# Patient Record
Sex: Female | Born: 1970 | Race: White | Hispanic: Yes | Marital: Married | Smoking: Never smoker
Health system: Southern US, Community
[De-identification: ages and names within clinical notes are randomized; demographics above are authoritative.]

## PROBLEM LIST (undated history)

## (undated) DIAGNOSIS — E039 Hypothyroidism, unspecified: Secondary | ICD-10-CM

## (undated) DIAGNOSIS — I10 Essential (primary) hypertension: Secondary | ICD-10-CM

## (undated) DIAGNOSIS — E559 Vitamin D deficiency, unspecified: Secondary | ICD-10-CM

## (undated) DIAGNOSIS — J019 Acute sinusitis, unspecified: Secondary | ICD-10-CM

## (undated) DIAGNOSIS — J069 Acute upper respiratory infection, unspecified: Secondary | ICD-10-CM

## (undated) DIAGNOSIS — T7840XA Allergy, unspecified, initial encounter: Secondary | ICD-10-CM

## (undated) DIAGNOSIS — E785 Hyperlipidemia, unspecified: Secondary | ICD-10-CM

## (undated) HISTORY — DX: Hypothyroidism, unspecified: E03.9

## (undated) HISTORY — DX: Acute upper respiratory infection, unspecified: J06.9

## (undated) HISTORY — DX: Essential (primary) hypertension: I10

## (undated) HISTORY — DX: Acute sinusitis, unspecified: J01.90

## (undated) HISTORY — DX: Vitamin D deficiency, unspecified: E55.9

## (undated) HISTORY — DX: Hyperlipidemia, unspecified: E78.5

## (undated) HISTORY — DX: Allergy, unspecified, initial encounter: T78.40XA

---

## 1999-03-05 HISTORY — PX: CHOLECYSTECTOMY: SHX55

## 1999-05-08 ENCOUNTER — Other Ambulatory Visit: Admission: RE | Admit: 1999-05-08 | Discharge: 1999-05-08 | Payer: Self-pay | Admitting: Gynecology

## 2002-10-06 ENCOUNTER — Other Ambulatory Visit: Admission: RE | Admit: 2002-10-06 | Discharge: 2002-10-06 | Payer: Self-pay | Admitting: Gynecology

## 2003-10-20 ENCOUNTER — Other Ambulatory Visit: Admission: RE | Admit: 2003-10-20 | Discharge: 2003-10-20 | Payer: Self-pay | Admitting: Gynecology

## 2003-12-09 ENCOUNTER — Inpatient Hospital Stay (HOSPITAL_COMMUNITY): Admission: AD | Admit: 2003-12-09 | Discharge: 2003-12-13 | Payer: Self-pay | Admitting: Gynecology

## 2005-02-15 ENCOUNTER — Other Ambulatory Visit: Admission: RE | Admit: 2005-02-15 | Discharge: 2005-02-15 | Payer: Self-pay | Admitting: Gynecology

## 2018-09-01 ENCOUNTER — Other Ambulatory Visit: Payer: Self-pay | Admitting: Family Medicine

## 2018-09-01 ENCOUNTER — Ambulatory Visit: Payer: Self-pay

## 2018-09-01 ENCOUNTER — Other Ambulatory Visit: Payer: Self-pay

## 2018-09-01 DIAGNOSIS — M79672 Pain in left foot: Secondary | ICD-10-CM

## 2018-09-01 DIAGNOSIS — M25572 Pain in left ankle and joints of left foot: Secondary | ICD-10-CM

## 2019-01-03 ENCOUNTER — Other Ambulatory Visit: Payer: Self-pay

## 2019-01-03 ENCOUNTER — Emergency Department (HOSPITAL_COMMUNITY)
Admission: EM | Admit: 2019-01-03 | Discharge: 2019-01-03 | Disposition: A | Discharge status: Home / Self Care | Payer: Self-pay | Attending: Emergency Medicine | Admitting: Emergency Medicine

## 2019-01-03 ENCOUNTER — Emergency Department (HOSPITAL_COMMUNITY): Payer: Self-pay

## 2019-01-03 DIAGNOSIS — U071 COVID-19: Secondary | ICD-10-CM | POA: Insufficient documentation

## 2019-01-03 DIAGNOSIS — Z20822 Contact with and (suspected) exposure to covid-19: Secondary | ICD-10-CM

## 2019-01-03 DIAGNOSIS — R1011 Right upper quadrant pain: Secondary | ICD-10-CM | POA: Insufficient documentation

## 2019-01-03 LAB — COMPREHENSIVE METABOLIC PANEL
ALT: 225 U/L — ABNORMAL HIGH (ref 0–44)
AST: 169 U/L — ABNORMAL HIGH (ref 15–41)
Albumin: 4.3 g/dL (ref 3.5–5.0)
Alkaline Phosphatase: 146 U/L — ABNORMAL HIGH (ref 38–126)
Anion gap: 11 (ref 5–15)
BUN: 7 mg/dL (ref 6–20)
CO2: 22 mmol/L (ref 22–32)
Calcium: 8.6 mg/dL — ABNORMAL LOW (ref 8.9–10.3)
Chloride: 100 mmol/L (ref 98–111)
Creatinine, Ser: 0.6 mg/dL (ref 0.44–1.00)
GFR calc Af Amer: >60 mL/min (ref >60)
GFR calc non Af Amer: >60 mL/min (ref >60)
Glucose, Bld: 133 mg/dL — ABNORMAL HIGH (ref 70–99)
Potassium: 3.6 mmol/L (ref 3.5–5.1)
Sodium: 133 mmol/L — ABNORMAL LOW (ref 135–145)
Total Bilirubin: 0.7 mg/dL (ref 0.3–1.2)
Total Protein: 8.6 g/dL — ABNORMAL HIGH (ref 6.5–8.1)

## 2019-01-03 LAB — CBC WITH DIFFERENTIAL/PLATELET
Abs Immature Granulocytes: 0.01 10*3/uL (ref 0.00–0.07)
Basophils Absolute: 0 10*3/uL (ref 0.0–0.1)
Basophils Relative: 0 %
Eosinophils Absolute: 0 10*3/uL (ref 0.0–0.5)
Eosinophils Relative: 0 %
HCT: 42.7 % (ref 36.0–46.0)
Hemoglobin: 14.3 g/dL (ref 12.0–15.0)
Immature Granulocytes: 0 %
Lymphocytes Relative: 29 %
Lymphs Abs: 1.7 10*3/uL (ref 0.7–4.0)
MCH: 32.1 pg (ref 26.0–34.0)
MCHC: 33.5 g/dL (ref 30.0–36.0)
MCV: 95.7 fL (ref 80.0–100.0)
Monocytes Absolute: 0.5 10*3/uL (ref 0.1–1.0)
Monocytes Relative: 9 %
Neutro Abs: 3.6 10*3/uL (ref 1.7–7.7)
Neutrophils Relative %: 62 %
Platelets: 230 10*3/uL (ref 150–400)
RBC: 4.46 MIL/uL (ref 3.87–5.11)
RDW: 12.3 % (ref 11.5–15.5)
WBC: 5.9 10*3/uL (ref 4.0–10.5)
nRBC: 0 % (ref 0.0–0.2)

## 2019-01-03 LAB — URINALYSIS, ROUTINE W REFLEX MICROSCOPIC
Bilirubin Urine: NEGATIVE
Glucose, UA: NEGATIVE mg/dL
Hgb urine dipstick: NEGATIVE
Ketones, ur: NEGATIVE mg/dL
Leukocytes,Ua: NEGATIVE
Nitrite: NEGATIVE
Specific Gravity, Urine: 1.01 (ref 1.005–1.030)
pH: 8.5 — ABNORMAL HIGH (ref 5.0–8.0)

## 2019-01-03 LAB — URINALYSIS, MICROSCOPIC (REFLEX)
Bacteria, UA: NONE SEEN
RBC / HPF: NONE SEEN RBC/hpf (ref 0–5)
Squamous Epithelial / LPF: NONE SEEN (ref 0–5)
WBC, UA: NONE SEEN WBC/hpf (ref 0–5)

## 2019-01-03 LAB — TROPONIN I (HIGH SENSITIVITY)
Troponin I (High Sensitivity): 3 ng/L (ref <18)
Troponin I (High Sensitivity): <2 ng/L (ref <18)

## 2019-01-03 LAB — LIPASE, BLOOD: Lipase: 31 U/L (ref 11–51)

## 2019-01-03 LAB — I-STAT BETA HCG BLOOD, ED (MC, WL, AP ONLY): I-stat hCG, quantitative: <5 m[IU]/mL (ref <5)

## 2019-01-03 LAB — SARS CORONAVIRUS 2 (TAT 6-24 HRS): SARS Coronavirus 2: POSITIVE — AB

## 2019-01-03 MED ORDER — METHOCARBAMOL 500 MG PO TABS: 500.0000 mg | ORAL_TABLET | Freq: Two times a day (BID) | ORAL | 0 refills | Status: AC

## 2019-01-03 MED ORDER — ONDANSETRON HCL 4 MG/2ML IJ SOLN
4.0000 mg | Freq: Once | INTRAMUSCULAR | Status: AC
  Administered 2019-01-03: 4 mg via INTRAVENOUS
  Filled 2019-01-03: qty 2

## 2019-01-03 MED ORDER — ACETAMINOPHEN 500 MG PO TABS
1000.0000 mg | ORAL_TABLET | Freq: Once | ORAL | Status: AC
  Administered 2019-01-03: 07:00:00 1000 mg via ORAL
  Filled 2019-01-03: qty 2

## 2019-01-03 MED ORDER — SODIUM CHLORIDE 0.9 % IV BOLUS
1000.0000 mL | Freq: Once | INTRAVENOUS | Status: AC
  Administered 2019-01-03: 1000 mL via INTRAVENOUS

## 2019-01-03 MED ORDER — ZINC GLUCONATE 50 MG PO TABS: 50.0000 mg | ORAL_TABLET | Freq: Every day | ORAL | 0 refills | Status: AC

## 2019-01-03 NOTE — ED Triage Notes (Signed)
Pt states that her husband is COVID+, she had test done Wednesday, but does not have results yet. Pt states that she is SOB, slight cough, having abd pain, lower back pain, dizziness, headache, fever, and racing heart/palpatations. Pts symptoms started Thursday.

## 2019-01-03 NOTE — ED Provider Notes (Addendum)
Accepted handoff at shift change from [facility]. Please see prior provider note for more detail.   S: 48 y.o. female who presents with 3 days of cough, body aches, nausea, abdominal pain, chest pressure.  Husband is Covid positive.   Presents with low-grade fever, increased respiratory rate.   PE/labs: Clear lungs, no respiratory distress.  Abdomen diffusely tender, no focal tenderness.  Labs pending.   DDX: concern for COVID-19 viral illness.   Plan: Patient received fluids, Zofran, Tylenol will reassess and likely discharge with conservative management plus Zofran, recommendations for over-the-counter medications.     Physical Exam  BP 124/69   Pulse 79   Temp 100.3 F (37.9 C) (Oral)   Resp (!) 24   Ht 5\' 4"  (1.626 m)   Wt 104.3 kg   SpO2 99%   BMI 39.48 kg/m   Physical Exam Vitals signs and nursing note reviewed.  Constitutional:      General: She is not in acute distress.    Appearance: Normal appearance.  HENT:     Head: Normocephalic and atraumatic.     Nose: Nose normal.  Eyes:     General: No scleral icterus.       Right eye: No discharge.        Left eye: No discharge.     Conjunctiva/sclera: Conjunctivae normal.  Neck:     Musculoskeletal: Normal range of motion.  Cardiovascular:     Rate and Rhythm: Normal rate and regular rhythm.     Pulses: Normal pulses.     Heart sounds: Normal heart sounds.  Pulmonary:     Effort: Pulmonary effort is normal. No respiratory distress.     Breath sounds: No wheezing.     Comments: Patient speaking full senses, no apparent respiratory distress, breathing sounds normal, respiratory rate 18, no accessory muscle usage Abdominal:     Palpations: Abdomen is soft.     Tenderness: There is abdominal tenderness. There is no right CVA tenderness or left CVA tenderness.     Comments: Diffuse abdominal tenderness that is mild without guarding, rebound. RUQ tenderness, negative murphy's sign.  Musculoskeletal:     Right  lower leg: No edema.     Left lower leg: No edema.  Skin:    General: Skin is warm and dry.     Capillary Refill: Capillary refill takes less than 2 seconds.  Neurological:     Mental Status: She is alert and oriented to person, place, and time. Mental status is at baseline.  Psychiatric:        Mood and Affect: Mood normal.        Behavior: Behavior normal.     ED Course/Procedures     Procedures  Dg Chest Portable 1 View  Result Date: 01/03/2019 CLINICAL DATA:  Worsening cough and generalized month myalgias for the past 3 days. Nausea and decreased appetite. Chest pressure and shortness of breath. Husband recently diagnosed with COVID-19. EXAM: PORTABLE CHEST 1 VIEW COMPARISON:  None. FINDINGS: The heart size and mediastinal contours are within normal limits. Both lungs are clear. The visualized skeletal structures are unremarkable. IMPRESSION: Normal examination. Electronically Signed   By: Claudie Revering M.D.   On: 01/03/2019 07:54   Labs Reviewed  COMPREHENSIVE METABOLIC PANEL - Abnormal; Notable for the following components:      Result Value   Sodium 133 (*)    Glucose, Bld 133 (*)    Calcium 8.6 (*)    Total Protein 8.6 (*)  AST 169 (*)    ALT 225 (*)    Alkaline Phosphatase 146 (*)    All other components within normal limits  URINALYSIS, ROUTINE W REFLEX MICROSCOPIC - Abnormal; Notable for the following components:   pH 8.5 (*)    Protein, ur TRACE (*)    All other components within normal limits  SARS CORONAVIRUS 2 (TAT 6-24 HRS)  LIPASE, BLOOD  CBC WITH DIFFERENTIAL/PLATELET  URINALYSIS, MICROSCOPIC (REFLEX)  I-STAT BETA HCG BLOOD, ED ([facility], [facility], [facility] ONLY)  TROPONIN I (HIGH SENSITIVITY)  TROPONIN I (HIGH SENSITIVITY)     MDM   Patient without white count elevation, no signs of infection on urinalysis.  Lipase within normal limits.  Independently reviewed chest x-ray and EKG both are without concerning findings.  Troponins negative.  Covid lab pending.     Patient with elevated transaminases--no baseline for comparison; patient states she is s/p cholecystectomy and denies history of pancreatitis. 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   Patient is mainly complaining of body aches and fatigue; likely transaminitis is chronic or otherwise not related to current presentation. Will recommend close follow up with PCP. Discussed OTC medication options. Patient requests medication for her backache will give robaxin for nighttime body aches.   Patient feels improved after IV fluids, Zofran, Tylenol.  Patient comfortable with being discharged home.  Agreeable to plan. Encouraged to follow-up with primary care as she has transaminase elevation and liver issues consistent with chronic steatohepatitis.  Patient standing.  Will follow up with PCP.  Understands recommendations for medications including Tylenol and others per discussion.  Patient given strict return precautions.    The patient appears reasonably screened and/or stabilized for discharge and I doubt any other medical condition or other [facility] requiring further screening, evaluation, or treatment in the ED at this time prior to discharge.  Patient is hemodynamically stable, in NAD, and able to ambulate in the ED. Pain has been managed or a plan has been made for home management and has no complaints prior to discharge. Patient is comfortable with above plan and is stable for discharge at this time. All questions were answered prior to disposition. Results from the ER workup discussed with the patient face to face and all questions answered to the best of my ability. The patient is safe for discharge with strict return precautions. Patient appears safe for discharge with appropriate follow-up.  Conveyed my impression with the patient and he voiced  understanding and is agreeable to plan.   An After Visit Summary was printed and given to the patient.  Portions of this note were generated with Scientist, clinical (histocompatibility and immunogenetics). Dictation errors may occur despite best attempts at proofreading.     Gailen Shelter, [place] 01/03/19 1138    Gailen Shelter, [place] 01/03/19 1141    Lorre Nick, MD 01/04/19 [phone]

## 2019-01-03 NOTE — ED Notes (Signed)
As I write this, she is undergoing her abd. U/s.

## 2019-01-03 NOTE — ED Provider Notes (Signed)
[facility] DEPT Provider Note   CSN: [id number] Arrival date & time: 01/03/19  3267     History   Chief Complaint Chief Complaint  Patient presents with  . covid  . Shortness of Breath    HPI Amber Payne is a 48 y.o. female who presents for evaluation of left 3 days of progressive worsening cough, generalized body aches, nausea, decreased appetite, shortness of breath, chest pressure.  Patient reports that her husband was recently diagnosed with COVID-19.  She has had an outpatient test done but she does not know the results of it.  She reports that about 3 days ago, she started developing symptoms.  She reports that since then, they have gotten significantly worse.  She states she has no energy and has generalized soreness, including to her back and abdomen.  She states she has had nausea but no vomiting but does report that she is had decreased appetite.  She states that she has had worsening shortness of breath and chest pressure.  She does state that the shortness of breath and chest pressure is worse with coughing.  She reports that after she gets into a coughing fit, she feels like her heart races and she gets lightheaded.  She states that she has run a fever and states that yesterday it was 100.1.  She has not been taking any Tylenol or ibuprofen.  She denies any dysuria, hematuria.  She does not smoke.  Denies any history of asthma.  She states she has been trying to stay away from her husband but they have been sharing the same house.     The history is provided by the patient.    No past medical history on file.  There are no active problems to display for this patient.   The histories are not reviewed yet. Please review them in the "History" navigator section and refresh this [place].   OB History   No obstetric history on file.      Home Medications    Prior to Admission medications   Not on File    Family History No family  history on file.  Social History Social History   Tobacco Use  . Smoking status: Not on file  Substance Use Topics  . Alcohol use: Not on file  . Drug use: Not on file     Allergies   Phenazopyridine   Review of Systems Review of Systems  Constitutional: Positive for appetite change, fatigue and fever.  Respiratory: Positive for cough and shortness of breath.   Cardiovascular: Positive for chest pain.  Gastrointestinal: Positive for nausea. Negative for abdominal pain and vomiting.  Genitourinary: Negative for dysuria and hematuria.  Musculoskeletal: Positive for myalgias.  Neurological: Negative for headaches.  All other systems reviewed and are negative.    Physical Exam Updated Vital Signs BP 113/88 (BP Location: Left Arm)   Pulse 71   Temp 100.3 F (37.9 C) (Oral)   Resp (!) 22   Ht 5\' 4"  (1.626 m)   Wt 104.3 kg   SpO2 100%   BMI 39.48 kg/m   Physical Exam Vitals signs and nursing note reviewed.  Constitutional:      Appearance: Normal appearance. She is well-developed.  HENT:     Head: Normocephalic and atraumatic.  Eyes:     General: Lids are normal.     Conjunctiva/sclera: Conjunctivae normal.     Pupils: Pupils are equal, round, and reactive to light.  Neck:  Musculoskeletal: Full passive range of motion without pain.  Cardiovascular:     Rate and Rhythm: Normal rate and regular rhythm.     Pulses: Normal pulses.     Heart sounds: Normal heart sounds. No murmur. No friction rub. No gallop.   Pulmonary:     Effort: Pulmonary effort is normal.     Breath sounds: Normal breath sounds.     Comments: Lungs clear to auscultation bilaterally.  Symmetric chest rise.  No wheezing, rales, rhonchi.  Able to speak in full sentences without any difficulty. Abdominal:     Palpations: Abdomen is soft. Abdomen is not rigid.     Tenderness: There is generalized abdominal tenderness. There is no guarding.     Comments: Abdomen soft, nondistended.  Diffuse  generalized tenderness with no focal point.  No rigidity, guarding.  Musculoskeletal: Normal range of motion.  Skin:    General: Skin is warm and dry.     Capillary Refill: Capillary refill takes less than 2 seconds.  Neurological:     Mental Status: She is alert and oriented to person, place, and time.  Psychiatric:        Speech: Speech normal.      ED Treatments / Results  Labs (all labs ordered are listed, but only abnormal results are displayed) Labs Reviewed  SARS CORONAVIRUS 2 (TAT 6-24 HRS)  COMPREHENSIVE METABOLIC PANEL  LIPASE, BLOOD  CBC WITH DIFFERENTIAL/PLATELET  URINALYSIS, ROUTINE W REFLEX MICROSCOPIC  I-STAT BETA HCG BLOOD, ED ([facility], [facility], [facility] ONLY)  TROPONIN I (HIGH SENSITIVITY)    EKG None  Radiology No results found.  Procedures Procedures (including critical care time)  Medications Ordered in ED Medications  sodium chloride 0.9 % bolus 1,000 mL (1,000 mLs Intravenous New Bag/Given 01/03/19 0635)  ondansetron (ZOFRAN) injection 4 mg (4 mg Intravenous Given 01/03/19 0639)  acetaminophen (TYLENOL) tablet 1,000 mg (1,000 mg Oral Given 01/03/19 [id number])     Initial Impression / Assessment and Plan / ED Course  I have reviewed the triage vital signs and the nursing notes.  Pertinent labs & imaging results that were available during my care of the patient were reviewed by me and considered in my medical decision making (see chart for details).        48 year old female who presents for evaluation of 3 days of progressively worsening cough, shortness of breath, chest pressure, generalized myalgias, fatigue, nausea, abdominal pain.  Reports has been recently diagnosed with COVID-19.  She has had an outpatient test done but she does not know the results.  No history of asthma.  She does not smoke.  On initial ED arrival, she has a low-grade temperature of 100.3.  Vitals otherwise stable.  No evidence of hypoxia.  No evidence of respiratory distress on lung exam.   She has diffuse abdominal tenderness with no focal point.  Consider viral process such as COVID-19, particularly given close exposure.  Plan for labs, chest x-ray, fluids.  [facility] to [facility] PA-C with labs and CXR  pending.   Amber Payne was evaluated in Emergency Department on 01/03/2019 for the symptoms described in the history of present illness. She was evaluated in the context of the global COVID-19 pandemic, which necessitated consideration that the patient might be at risk for infection with the SARS-CoV-2 virus that causes COVID-19. Institutional protocols and algorithms that pertain to the evaluation of patients at risk for COVID-19 are in a state of rapid change based on information released by regulatory  bodies including the CDC and federal and state organizations. These policies and algorithms were followed during the patient's care in the ED.  Portions of this note were generated with Scientist, clinical (histocompatibility and immunogenetics). Dictation errors may occur despite best attempts at proofreading.   Final Clinical Impressions(s) / ED Diagnoses   Final diagnoses:  None    ED Discharge Orders    None       Maxwell Caul, PA-C 01/03/19 0640    Gilda Crease, MD 01/03/19 [phone]

## 2019-01-03 NOTE — Discharge Instructions (Addendum)
Please continue to use your over-the-counter symptomatic medication as discussed.   I prescribed you Robaxin for muscle aches.  Also zinc as it may be helpful with COVID 19. You may also take vitamin D over the counter. Your COVID test is pending; you should expect results in 2-3 days. You can access your results on your MyChart--if you test positive you should receive a phone call.  In the meantime follow CDC guidelines and quarantine, wear a mask, wash hands often.  I have prescribed prednisone please take this as instructed.  Please take over the counter vitamin D 2000-4000 units per day. I have also prescribed zinc 50 mg per day for the next two weeks.   Please return to ED if you feel have difficulty breathing or have emergent, new or concerning symptoms.  Patients who have symptoms consistent with COVID-19 should self isolated for: At least 3 days (72 hours) have passed since recovery, defined as resolution of fever without the use of fever reducing medications and improvement in respiratory symptoms (e.g., cough, shortness of breath), and At least 7 days have passed since symptoms first appeared.       Person Under Monitoring Name: Amber Payne  Location: [address] [place] [id number]   Infection Prevention Recommendations for Individuals Confirmed to have, or Being Evaluated for, 2019 Novel Coronavirus (COVID-19) Infection Who Receive Care at Home  Individuals who are confirmed to have, or are being evaluated for, COVID-19 should follow the prevention steps below until a healthcare provider or local or state health department says they can return to normal activities.  Stay home except to get medical care You should restrict activities outside your home, except for getting medical care. Do not go to work, school, or public areas, and do not use public transportation or taxis.  Call ahead before visiting your doctor Before your medical appointment, call the  healthcare provider and tell them that you have, or are being evaluated for, COVID-19 infection. This will help the healthcare providers office take steps to keep other people from getting infected. Ask your healthcare provider to call the local or state health department.  Monitor your symptoms Seek prompt medical attention if your illness is worsening (e.g., difficulty breathing). Before going to your medical appointment, call the healthcare provider and tell them that you have, or are being evaluated for, COVID-19 infection. Ask your healthcare provider to call the local or state health department.  Wear a facemask You should wear a facemask that covers your nose and mouth when you are in the same room with other people and when you visit a healthcare provider. People who live with or visit you should also wear a facemask while they are in the same room with you.  Separate yourself from other people in your home As much as possible, you should stay in a different room from other people in your home. Also, you should use a separate bathroom, if available.  Avoid sharing household items You should not share dishes, drinking glasses, cups, eating utensils, towels, bedding, or other items with other people in your home. After using these items, you should wash them thoroughly with soap and water.  Cover your coughs and sneezes Cover your mouth and nose with a tissue when you cough or sneeze, or you can cough or sneeze into your sleeve. Throw used tissues in a lined trash can, and immediately wash your hands with soap and water for at least 20 seconds or use an alcohol-based  hand rub.  Wash your [facility] your hands often and thoroughly with soap and water for at least 20 seconds. You can use an alcohol-based hand sanitizer if soap and water are not available and if your hands are not visibly dirty. Avoid touching your eyes, nose, and mouth with unwashed hands.   Prevention Steps for  Caregivers and Household Members of Individuals Confirmed to have, or Being Evaluated for, COVID-19 Infection Being Cared for in the Home  If you live with, or provide care at home for, a person confirmed to have, or being evaluated for, COVID-19 infection please follow these guidelines to prevent infection:  Follow healthcare providers instructions Make sure that you understand and can help the patient follow any healthcare provider instructions for all care.  Provide for the patients basic needs You should help the patient with basic needs in the home and provide support for getting groceries, prescriptions, and other personal needs.  Monitor the patients symptoms If they are getting sicker, call his or her medical provider and tell them that the patient has, or is being evaluated for, COVID-19 infection. This will help the healthcare providers office take steps to keep other people from getting infected. Ask the healthcare provider to call the local or state health department.  Limit the number of people who have contact with the patient If possible, have only one caregiver for the patient. Other household members should stay in another home or place of residence. If this is not possible, they should stay in another room, or be separated from the patient as much as possible. Use a separate bathroom, if available. Restrict visitors who do not have an essential need to be in the home.  Keep older adults, very young children, and other sick people away from the patient Keep older adults, very young children, and those who have compromised immune systems or chronic health conditions away from the patient. This includes people with chronic heart, lung, or kidney conditions, diabetes, and cancer.  Ensure good ventilation Make sure that shared spaces in the home have good air flow, such as from an air conditioner or an opened window, weather permitting.  Wash your hands often Wash  your hands often and thoroughly with soap and water for at least 20 seconds. You can use an alcohol based hand sanitizer if soap and water are not available and if your hands are not visibly dirty. Avoid touching your eyes, nose, and mouth with unwashed hands. Use disposable paper towels to dry your hands. If not available, use dedicated cloth towels and replace them when they become wet.  Wear a facemask and gloves Wear a disposable facemask at all times in the room and gloves when you touch or have contact with the patients blood, body fluids, and/or secretions or excretions, such as sweat, saliva, sputum, nasal mucus, vomit, urine, or feces.  Ensure the mask fits over your nose and mouth tightly, and do not touch it during use. Throw out disposable facemasks and gloves after using them. Do not reuse. Wash your hands immediately after removing your facemask and gloves. If your personal clothing becomes contaminated, carefully remove clothing and launder. Wash your hands after handling contaminated clothing. Place all used disposable facemasks, gloves, and other waste in a lined container before disposing them with other household waste. Remove gloves and wash your hands immediately after handling these items.  Do not share dishes, glasses, or other household items with the patient Avoid sharing household items. You should not  share dishes, drinking glasses, cups, eating utensils, towels, bedding, or other items with a patient who is confirmed to have, or being evaluated for, COVID-19 infection. After the person uses these items, you should wash them thoroughly with soap and water.  Wash laundry thoroughly Immediately remove and wash clothes or bedding that have blood, body fluids, and/or secretions or excretions, such as sweat, saliva, sputum, nasal mucus, vomit, urine, or feces, on them. Wear gloves when handling laundry from the patient. Read and follow directions on labels of laundry or  clothing items and detergent. In general, wash and dry with the warmest temperatures recommended on the label.  Clean all areas the individual has used often Clean all touchable surfaces, such as counters, tabletops, doorknobs, bathroom fixtures, toilets, phones, keyboards, tablets, and bedside tables, every day. Also, clean any surfaces that may have blood, body fluids, and/or secretions or excretions on them. Wear gloves when cleaning surfaces the patient has come in contact with. Use a diluted bleach solution (e.g., dilute bleach with 1 part bleach and 10 parts water) or a household disinfectant with a label that says EPA-registered for coronaviruses. To make a bleach solution at home, add 1 tablespoon of bleach to 1 quart (4 cups) of water. For a larger supply, add  cup of bleach to 1 gallon (16 cups) of water. Read labels of cleaning products and follow recommendations provided on product labels. Labels contain instructions for safe and effective use of the cleaning product including precautions you should take when applying the product, such as wearing gloves or eye protection and making sure you have good ventilation during use of the product. Remove gloves and wash hands immediately after cleaning.  Monitor yourself for signs and symptoms of illness Caregivers and household members are considered close contacts, should monitor their health, and will be asked to limit movement outside of the home to the extent possible. Follow the monitoring steps for close contacts listed on the symptom monitoring form.   ? If you have additional questions, contact your local health department or call the epidemiologist on call at [phone] (available 24/7). ? This guidance is subject to change. For the most up-to-date guidance from [facility], please refer to their website: [employer]

## 2020-07-10 IMAGING — DX DG CHEST 1V PORT
1 series · 1 of 1 positions shown · non-contrast
Comparison: None.

CLINICAL DATA: Worsening cough and generalized month myalgias for
the past 3 days. Nausea and decreased appetite. Chest pressure and
shortness of breath. Husband recently diagnosed with [id number].

EXAM:
PORTABLE CHEST 1 VIEW

[chest ap]
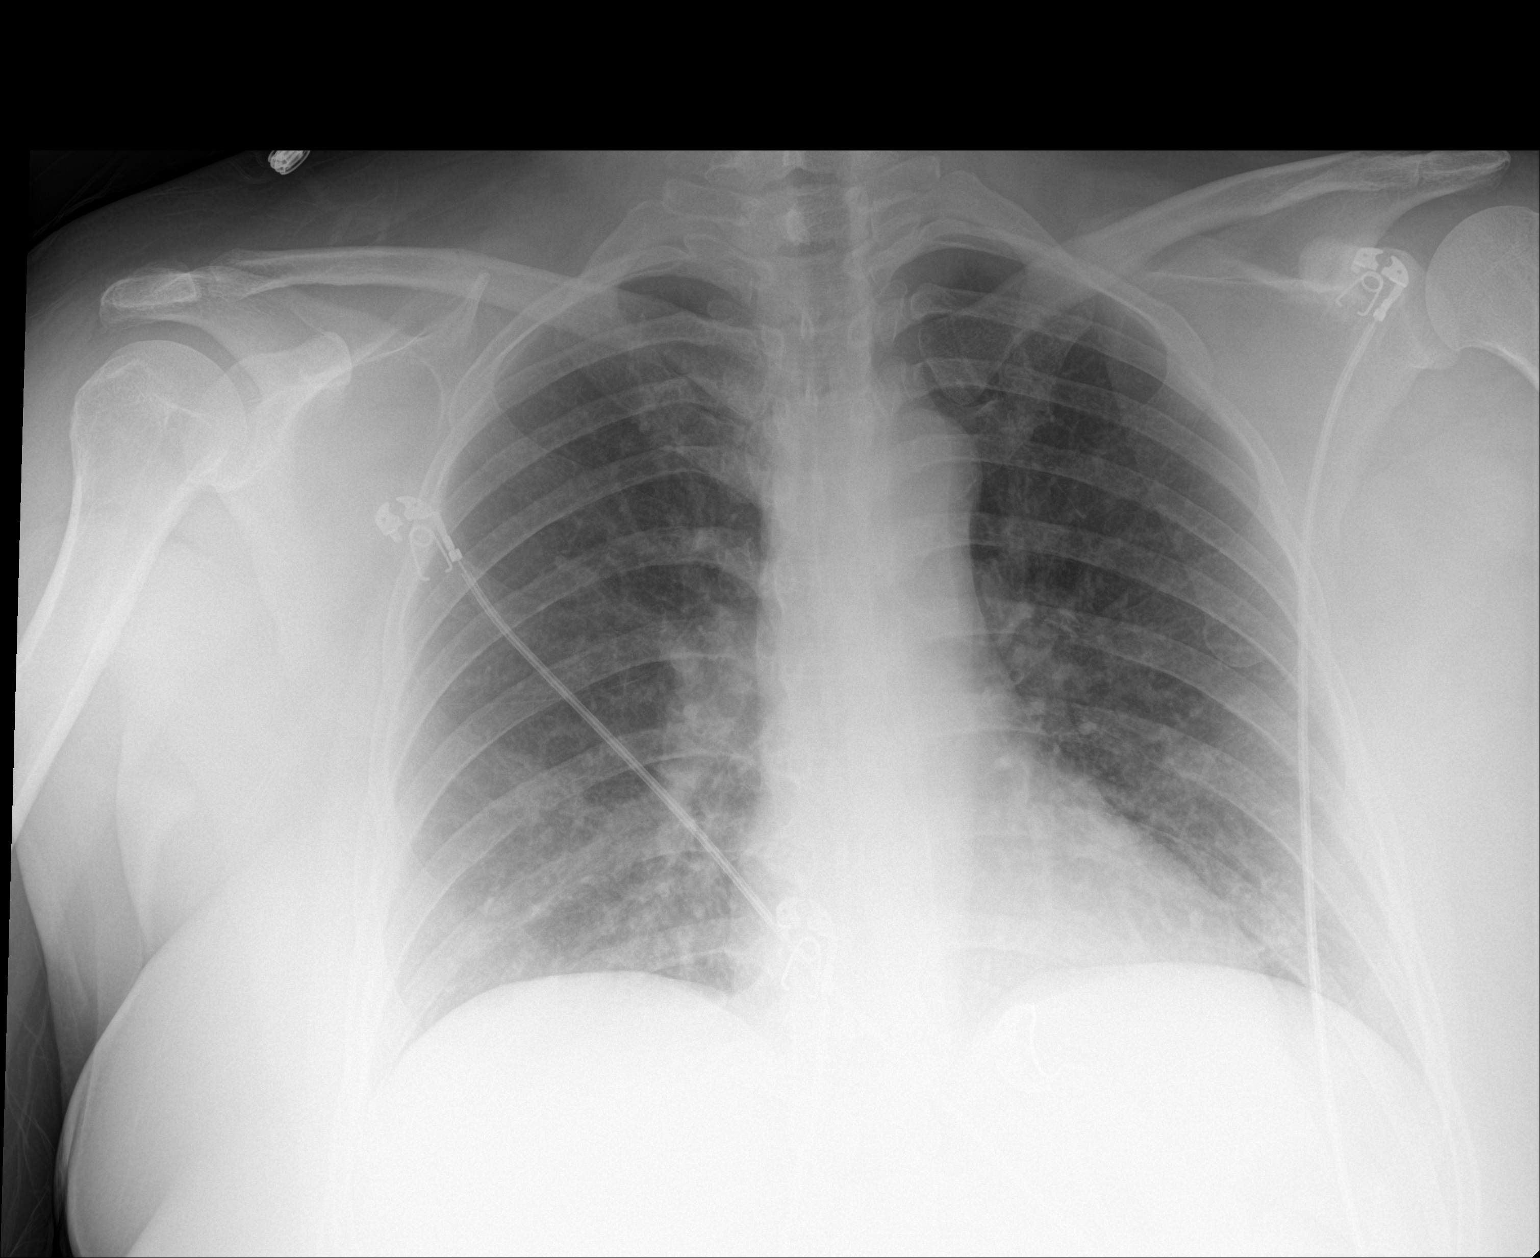

[1 of 1 positions shown; findings below may reference images not displayed]

FINDINGS: The heart size and mediastinal contours are within normal limits.
Both lungs are clear. The visualized skeletal structures are
unremarkable.
IMPRESSION: Normal examination.

## 2023-11-14 ENCOUNTER — Other Ambulatory Visit: Payer: Self-pay | Admitting: Family

## 2023-11-14 DIAGNOSIS — Z1231 Encounter for screening mammogram for malignant neoplasm of breast: Secondary | ICD-10-CM

## 2023-11-21 ENCOUNTER — Inpatient Hospital Stay: Admission: RE | Admit: 2023-11-21 | Payer: Self-pay | Source: Ambulatory Visit

## 2024-02-10 ENCOUNTER — Inpatient Hospital Stay: Admission: RE | Admit: 2024-02-10 | Payer: Self-pay | Source: Ambulatory Visit

## 2024-02-19 NOTE — Progress Notes (Unsigned)
 02/19/2024 LARENDA REEDY [id number] Oct 02, 1970  Referring provider: Silvano Angeline FALCON, NP Primary GI doctor: {[id number]}  ASSESSMENT AND PLAN:  Nausea Status post cholecystectomy  Abnormal right upper quadrant suspicious for severe hepatic steatosis or cirrhosis in 2020 01/2019 AST 169 ALT 225 alk phos 146 total bilirubin 0.7 01/03/2019 RUQ US  markedly echogenic liver most likely due to hepatic steatosis chronic hepatitis and cirrhosis can also have this appearance, platelets 230  Patient Care Team: Lanier Rexene MATSU, NP as PCP - General (Nurse Practitioner)  HISTORY OF PRESENT ILLNESS: 53 y.o. female with a past medical history listed below presents for evaluation of ***.   *** Discussed the use of AI scribe software for clinical note transcription with the patient, who gave verbal consent to proceed.  History of Present Illness            She  has no history on file for tobacco use, alcohol use, and drug use.  RELEVANT GI HISTORY, IMAGING AND LABS: Results          CBC    Component Value Date/Time   WBC 5.9 01/03/2019 0621   RBC 4.46 01/03/2019 0621   HGB 14.3 01/03/2019 0621   HCT 42.7 01/03/2019 0621   PLT 230 01/03/2019 0621   MCV 95.7 01/03/2019 0621   MCH 32.1 01/03/2019 0621   MCHC 33.5 01/03/2019 0621   RDW 12.3 01/03/2019 0621   LYMPHSABS 1.7 01/03/2019 0621   MONOABS 0.5 01/03/2019 0621   EOSABS 0.0 01/03/2019 0621   BASOSABS 0.0 01/03/2019 0621   No results for input(s): HGB in the last 8760 hours.  CMP     Component Value Date/Time   NA 133 (L) 01/03/2019 0621   K 3.6 01/03/2019 0621   CL 100 01/03/2019 0621   CO2 22 01/03/2019 0621   GLUCOSE 133 (H) 01/03/2019 0621   BUN 7 01/03/2019 0621   CREATININE 0.60 01/03/2019 0621   CALCIUM 8.6 (L) 01/03/2019 0621   PROT 8.6 (H) 01/03/2019 0621   ALBUMIN 4.3 01/03/2019 0621   AST 169 (H) 01/03/2019 0621   ALT 225 (H) 01/03/2019 0621   ALKPHOS 146 (H) 01/03/2019 0621   BILITOT 0.7  01/03/2019 0621   GFRNONAA >60 01/03/2019 0621   GFRAA >60 01/03/2019 0621      Latest Ref Rng & Units 01/03/2019    6:21 AM  Hepatic Function  Total Protein 6.5 - 8.1 g/dL 8.6   Albumin 3.5 - 5.0 g/dL 4.3   AST 15 - 41 U/L 830   ALT 0 - 44 U/L 225   Alk Phosphatase 38 - 126 U/L 146   Total Bilirubin 0.3 - 1.2 mg/dL 0.7       Current Medications:   Current Outpatient Medications (Cardiovascular):    lisinopril-hydrochlorothiazide (ZESTORETIC) 20-12.5 MG tablet, Take 2 tablets by mouth daily.   rosuvastatin (CRESTOR) 10 MG tablet, Take 10 mg by mouth at bedtime.  Current Outpatient Medications (Respiratory):    Chlorphen-Pseudoephed-APAP (CORICIDIN D PO), Take 1 tablet by mouth 4 (four) times daily as needed (congestion).  Current Outpatient Medications (Analgesics):    acetaminophen  (TYLENOL ) 325 MG tablet, Take 650 mg by mouth every 6 (six) hours as needed for mild pain or headache.   meloxicam (MOBIC) 7.5 MG tablet, Take 7.5 mg by mouth 2 (two) times daily as needed for muscle spasms.  Current Outpatient Medications (Other):    buPROPion (WELLBUTRIN XL) 150 MG 24 hr tablet, Take 150 mg by mouth every morning.  diclofenac sodium (VOLTAREN) 1 % GEL, Apply 1 g topically 4 (four) times daily as needed for muscle pain.   gabapentin (NEURONTIN) 300 MG capsule, Take 300 mg by mouth 3 (three) times daily.   methocarbamol  (ROBAXIN ) 500 MG tablet, Take 1 tablet (500 mg total) by mouth 2 (two) times daily.  Medical History:  Past Medical History:  Diagnosis Date   Acute sinusitis    Acute upper respiratory infection    Essential hypertension    Hyperlipidemia    Hypothyroidism    Vitamin D deficiency    Allergies: Allergies[1]   Surgical History:  She  has no past surgical history on file. Family History:  Her family history is not on file.  REVIEW OF SYSTEMS  : All other systems reviewed and negative except where noted in the History of Present Illness.  PHYSICAL  EXAM: There were no vitals taken for this visit. Physical Exam          Alan JONELLE Coombs, PA-C 12:56 PM      [1]  Allergies Allergen Reactions   Phenazopyridine

## 2024-02-20 ENCOUNTER — Other Ambulatory Visit

## 2024-02-20 ENCOUNTER — Encounter: Payer: Self-pay | Admitting: Physician Assistant

## 2024-02-20 ENCOUNTER — Ambulatory Visit: Payer: Self-pay | Admitting: Physician Assistant

## 2024-02-20 VITALS — BP 124/80 | HR 75 | Ht 64.0 in | Wt 229.0 lb

## 2024-02-20 DIAGNOSIS — D7589 Other specified diseases of blood and blood-forming organs: Secondary | ICD-10-CM | POA: Diagnosis not present

## 2024-02-20 DIAGNOSIS — K219 Gastro-esophageal reflux disease without esophagitis: Secondary | ICD-10-CM

## 2024-02-20 DIAGNOSIS — R110 Nausea: Secondary | ICD-10-CM | POA: Diagnosis not present

## 2024-02-20 DIAGNOSIS — Z6839 Body mass index (BMI) 39.0-39.9, adult: Secondary | ICD-10-CM

## 2024-02-20 DIAGNOSIS — R109 Unspecified abdominal pain: Secondary | ICD-10-CM | POA: Diagnosis not present

## 2024-02-20 DIAGNOSIS — Z8379 Family history of other diseases of the digestive system: Secondary | ICD-10-CM | POA: Diagnosis not present

## 2024-02-20 DIAGNOSIS — K760 Fatty (change of) liver, not elsewhere classified: Secondary | ICD-10-CM

## 2024-02-20 DIAGNOSIS — R12 Heartburn: Secondary | ICD-10-CM

## 2024-02-20 DIAGNOSIS — Z1211 Encounter for screening for malignant neoplasm of colon: Secondary | ICD-10-CM

## 2024-02-20 DIAGNOSIS — R748 Abnormal levels of other serum enzymes: Secondary | ICD-10-CM

## 2024-02-20 DIAGNOSIS — R932 Abnormal findings on diagnostic imaging of liver and biliary tract: Secondary | ICD-10-CM | POA: Diagnosis not present

## 2024-02-20 LAB — IBC + FERRITIN
Ferritin: 160.8 ng/mL (ref 10.0–291.0)
Iron: 120 ug/dL (ref 42–145)
Saturation Ratios: 43.3 % (ref 20.0–50.0)
TIBC: 277.2 ug/dL (ref 250.0–450.0)
Transferrin: 198 mg/dL — ABNORMAL LOW (ref 212.0–360.0)

## 2024-02-20 LAB — HEPATIC FUNCTION PANEL
ALT: 45 U/L — ABNORMAL HIGH (ref 3–35)
AST: 40 U/L — ABNORMAL HIGH (ref 5–37)
Albumin: 4.5 g/dL (ref 3.5–5.2)
Alkaline Phosphatase: 107 U/L (ref 39–117)
Bilirubin, Direct: 0.1 mg/dL (ref 0.1–0.3)
Total Bilirubin: 0.4 mg/dL (ref 0.2–1.2)
Total Protein: 7.9 g/dL (ref 6.0–8.3)

## 2024-02-20 LAB — VITAMIN B12: Vitamin B-12: 556 pg/mL (ref 211–911)

## 2024-02-20 LAB — CBC WITH DIFFERENTIAL/PLATELET
Basophils Absolute: 0 K/uL (ref 0.0–0.1)
Basophils Relative: 0.5 % (ref 0.0–3.0)
Eosinophils Absolute: 0.1 K/uL (ref 0.0–0.7)
Eosinophils Relative: 2 % (ref 0.0–5.0)
HCT: 41.1 % (ref 36.0–46.0)
Hemoglobin: 13.8 g/dL (ref 12.0–15.0)
Lymphocytes Relative: 43.7 % (ref 12.0–46.0)
Lymphs Abs: 3.2 K/uL (ref 0.7–4.0)
MCHC: 33.5 g/dL (ref 30.0–36.0)
MCV: 94.9 fl (ref 78.0–100.0)
Monocytes Absolute: 0.4 K/uL (ref 0.1–1.0)
Monocytes Relative: 6.1 % (ref 3.0–12.0)
Neutro Abs: 3.5 K/uL (ref 1.4–7.7)
Neutrophils Relative %: 47.7 % (ref 43.0–77.0)
Platelets: 311 K/uL (ref 150.0–400.0)
RBC: 4.34 Mil/uL (ref 3.87–5.11)
RDW: 12.7 % (ref 11.5–15.5)
WBC: 7.3 K/uL (ref 4.0–10.5)

## 2024-02-20 LAB — PROTIME-INR
INR: 1.1 ratio — ABNORMAL HIGH (ref 0.8–1.0)
Prothrombin Time: 12 s (ref 9.6–13.1)

## 2024-02-20 LAB — FOLATE: Folate: 20.6 ng/mL

## 2024-02-20 MED ORDER — SUTAB 1479-225-188 MG PO TABS: 24.0000 | ORAL_TABLET | ORAL | 0 refills | Status: DC

## 2024-02-20 MED ORDER — NA SULFATE-K SULFATE-MG SULF 17.5-3.13-1.6 GM/177ML PO SOLN: 1.0000 | Freq: Once | ORAL | 0 refills | Status: AC

## 2024-02-20 MED ORDER — OMEPRAZOLE 40 MG PO CPDR: 40.0000 mg | DELAYED_RELEASE_CAPSULE | Freq: Every day | ORAL | 3 refills | Status: AC

## 2024-02-20 NOTE — Patient Instructions (Addendum)
 Your provider has requested that you go to the basement level for lab work before leaving today. Press B on the elevator. The lab is located at the first door on the left as you exit the elevator.  We have sent the following medications to your pharmacy for you to pick up at your convenience: Suprep  You have been scheduled for an abdominal ultrasound at [facility] (1st floor of hospital) on 02-25-24 at 1030am. Please arrive 15 minutes prior to your appointment for registration. Make certain not to have anything to eat or drink midnight prior to your appointment. Should you need to reschedule your appointment, please contact radiology at [phone]. This test typically takes about 30 minutes to perform.   You have been scheduled for an endoscopy and colonoscopy. Please follow the written instructions given to you at your visit today.  If you use inhalers (even only as needed), please bring them with you on the day of your procedure.  DO NOT TAKE 7 DAYS PRIOR TO TEST- Trulicity (dulaglutide) Ozempic, Wegovy (semaglutide) Mounjaro, Zepbound (tirzepatide) Bydureon Bcise (exanatide extended release)  DO NOT TAKE 1 DAY PRIOR TO YOUR TEST Rybelsus (semaglutide) Adlyxin (lixisenatide) Victoza (liraglutide) Byetta (exanatide) ___________________________________________________________________________  Understanding Your Weekly GLP-1 Injection  A helpful guide to managing common side effects  You are on a once-weekly injectable medication in the GLP-1 receptor agonist class. These medications can be very effective for blood sugar control, weight loss, and heart protection, fatty liver or OSA, but they can also come with some side effects that are important to understand. The good news is: most side effects can be managed with a few adjustments.  1. Gastroparesis-Like Symptoms These medications slow down your stomach to help you feel full longer -- great for weight loss and blood  sugar control, but they can sometimes cause symptoms that feel like gastroparesis (slow stomach emptying). Symptoms may include: -Feeling full quickly when eating -Nausea or vomiting -Bloating or abdominal discomfort -Worsening heartburn or reflux -Acid regurgitation -Stomach spasms or tightness What you can do: ??? Eat small, frequent meals (4-6 per day) ?? Drink fluids between meals, not during ?? Avoid high-fiber foods (like raw veggies or whole grains); cook your veggies well ?? Spread protein throughout the day (try Greek yogurt, eggs, soft meats, Glucerna, milk) ?? Choose soft foods you can mash with a fork ?? Switch to pured foods or liquids during flare-ups ?? Consider reading: Living Well with Gastroparesis by Camelia Bone ?? Downloadable Diet Guide: [facility] Gastroparesis Diet PDF  ?? Tip: Try following a gastroparesis-friendly diet on the day of your injection and the day after, when the medication's effect is strongest.  2. Constipation Since this medication slows down your digestive system, constipation is very common. Tips to help: ?? Drink plenty of water ???? Stay active with regular exercise ?? Add fiber-rich but gentle foods like kiwi ?? Try a low-dose magnesium supplement at night ?? Use MiraLAX (half to one capful daily) if constipation becomes more frequent, especially if your dose increases  If these strategies dont help, talk to your provider -- they may recommend or prescribe other treatments.  3. When to Call the Doctor or Go to the ER While rare, this medication can slightly increase your risk of serious conditions like: Pancreatitis (inflammation of the pancreas) Gallstones or gallbladder problems Watch for these signs and seek help if you experience: Severe abdominal pain (especially in the upper belly or that radiates to your back) Pain in the right  upper side of your abdomen Nausea, vomiting, fever, or chills that dont go away ??  Call your provider or go to the ER if these occur.  4. Who Should NOT Take This Medication? This medication should be avoided if you have: A personal history of pancreatitis  A personal or family history of medullary thyroid cancer A condition called Multiple Endocrine Neoplasia Syndrome Type 2 (MEN2)  Final Note If the side effects are too bothersome, remember: Most symptoms will go away if you stop the medication. But many people tolerate it well after the first few weeks, especially with the right strategies in place.   Gastroparesia La gastroparesia es una afeccin en la que los alimentos tardan ms de lo normal en vaciarse del estmago. Esta afeccin tambin se conoce como vaciamiento gstrico retardado. Suele ser una afeccin crnica.  Cules son los signos o sntomas? Los sntomas de esta afeccin incluyen: ? Sensacin de saciedad despus de comer muy poco o prdida de apetito. ? Nuseas, vmitos o acidez estomacal. ? Hinchazn abdominal. ? Niveles irregulares de azcar (glucosa) en sangre en los anlisis de [place]. ? Prdida de peso inexplicable. ? cido del estmago que sube al esfago (reflujo gastroesofgico). ? Espasmo repentino del estmago, que puede ser doloroso. ? Los sntomas pueden aparecer y geneticist, molecular. Algunas personas pueden no notar ningn sntoma.  Cosas que puede hacer: 1. Haga comidas pequeas y frecuentes, como de 4 a 6 comidas al futures trader. 2. Coma y beba lquidos en horarios separados. 3. Evite los alimentos ricos en fibra, cocine las verduras y evite los alimentos ricos en grasas. 4. Sugiera distribuir las protenas a lo largo del da (yogur griego, glucerna, carnes blandas, [place], [place]). 5. Elija alimentos blandos que pueda triturar con un tenedor. 6. Cuando presente ms sntomas, cambie a alimentos y physicist, medical. 7. Considere leer Vivir bien con gastroparesia de Crystal Zaborrowski. Consulte este enlace para obtener una dieta en lnea:  https://my.groupjournal.fr  Metabolic dysfunction associated seatohepatitis  Now the leading cause of liver failure in the [place] .  It is normally from such risk factors as obesity, diabetes, insulin resistance, high cholesterol, or metabolic syndrome.  The only definitive therapy is weight loss and exercise.   Suggest walking 20-30 mins daily.  Decreasing carbohydrates, increasing veggies.    Fatty Liver Fatty liver is the accumulation of fat in liver cells. It is also called hepatosteatosis or steatohepatitis. It is normal for your liver to contain some fat. If fat is more than 5 to 10% of your liver's weight, you have fatty liver.  There are often no symptoms (problems) for years while damage is still occurring. People often learn about their fatty liver when they have medical tests for other reasons. Fat can damage your liver for years or even decades without causing problems. When it becomes severe, it can cause fatigue, weight loss, weakness, and confusion. This makes you more likely to develop more serious liver problems. The liver is the largest organ in the body. It does a lot of work and often gives no warning signs when it is sick until late in a disease. The liver has many important jobs including: Breaking down foods. Storing vitamins, iron, and other minerals. Making proteins. Making bile for food digestion. Breaking down many products including medications, alcohol and some poisons.  PROGNOSIS  Fatty liver may cause no damage or it can lead to an inflammation of the liver. This is, called steatohepatitis.  Over time the liver may become scarred and hardened.  This condition is called cirrhosis. Cirrhosis is serious and may lead to liver failure or cancer. NASH is one of the leading causes of cirrhosis. About 10-20% of Americans have fatty liver and a smaller 2-5% has NASH.  TREATMENT   Weight loss, fat restriction, and exercise in overweight patients produces inconsistent results but is worth trying. Good control of diabetes may reduce fatty liver. Eat a balanced, healthy diet. Increase your physical activity. There are no medical or surgical treatments for a fatty liver or NASH, but improving your diet and increasing your exercise may help prevent or reverse some of the damage.

## 2024-02-24 LAB — ALPHA-GAL PANEL
Allergen, Mutton, f88: <0.1 kU/L
Allergen, Pork, f26: <0.1 kU/L
Beef: <0.1 kU/L
CLASS: 0
CLASS: 0
Class: 0
GALACTOSE-ALPHA-1,3-GALACTOSE IGE*: <0.1 kU/L

## 2024-02-24 LAB — IGG: IgG (Immunoglobin G), Serum: 1776 mg/dL — ABNORMAL HIGH (ref 600–1640)

## 2024-02-24 LAB — ANA: Anti Nuclear Antibody (ANA): POSITIVE — AB

## 2024-02-24 LAB — ANTI-NUCLEAR AB-TITER (ANA TITER)
ANA TITER: 1:160 {titer} — ABNORMAL HIGH
ANA Titer 1: 1:80 {titer} — ABNORMAL HIGH

## 2024-02-24 LAB — CERULOPLASMIN: Ceruloplasmin: 29 mg/dL (ref 14–48)

## 2024-02-24 LAB — IGA: Immunoglobulin A: 334 mg/dL — ABNORMAL HIGH (ref 47–310)

## 2024-02-24 LAB — ALPHA-1-ANTITRYPSIN: A-1 Antitrypsin, Ser: 153 mg/dL (ref 83–199)

## 2024-02-24 LAB — MITOCHONDRIAL ANTIBODIES: Mitochondrial M2 Ab, IgG: <=20 U

## 2024-02-24 LAB — ANTI-SMOOTH MUSCLE ANTIBODY, IGG: Actin (Smooth Muscle) Antibody (IGG): <20 U

## 2024-02-24 LAB — TISSUE TRANSGLUTAMINASE, IGA: (tTG) Ab, IgA: <1 U/mL

## 2024-02-24 LAB — INTERPRETATION:

## 2024-02-25 ENCOUNTER — Ambulatory Visit (HOSPITAL_COMMUNITY)

## 2024-02-26 LAB — ENHANCED LIVER FIBROSIS (ELF): ENHANCED LIVER FIBROSIS (ELF) SCORE: 9.11

## 2024-03-01 ENCOUNTER — Ambulatory Visit: Payer: Self-pay | Admitting: Physician Assistant

## 2024-03-05 ENCOUNTER — Ambulatory Visit (HOSPITAL_COMMUNITY)
Admission: RE | Admit: 2024-03-05 | Discharge: 2024-03-05 | Disposition: A | Discharge status: Home / Self Care | Source: Ambulatory Visit | Attending: Physician Assistant | Admitting: Physician Assistant

## 2024-03-05 DIAGNOSIS — R748 Abnormal levels of other serum enzymes: Secondary | ICD-10-CM | POA: Insufficient documentation

## 2024-03-05 DIAGNOSIS — R110 Nausea: Secondary | ICD-10-CM | POA: Diagnosis present

## 2024-03-05 DIAGNOSIS — K760 Fatty (change of) liver, not elsewhere classified: Secondary | ICD-10-CM | POA: Diagnosis present

## 2024-03-18 ENCOUNTER — Telehealth: Payer: Self-pay | Admitting: Pediatrics

## 2024-03-18 MED ORDER — ONDANSETRON HCL 4 MG PO TABS: 4.0000 mg | ORAL_TABLET | Freq: Three times a day (TID) | ORAL | 0 refills | Status: AC | PRN

## 2024-03-18 NOTE — Telephone Encounter (Signed)
 I was contacted on-call by Ms. Stefanski's daughter regarding symptoms of vomiting after completing the first portion of her Sutab  bowel prep.  Approximately 30 minutes after consuming 12 tablets that she had recurrent vomiting.  She does not think that she vomited the tablets back up.  At the time of our phone call her vomiting had quieted down.  Chart review indicates that she is undergoing EGD for gastroparesis like symptoms and nausea.  Discussed with her daughter that she may continue to experience similar symptoms of nausea and vomiting with the remainder of the prep.  Advised that she consume the water portion of the prep slowly with sips and drinking through a straw.  Offered to call in a prescription for ondansetron  tablets that she can take during bowel preparation to help with nausea and vomiting.  Her daughter requested that we proceed with sending in the prescription.  Prescription sent to [facility] in [place], [place]  for ondansetron  4 mg tablets p.o. every 8 hours as needed for nausea and vomiting.  All questions answered.

## 2024-03-19 ENCOUNTER — Encounter: Payer: Self-pay | Admitting: Gastroenterology

## 2024-03-19 ENCOUNTER — Ambulatory Visit: Admitting: Gastroenterology

## 2024-03-19 VITALS — BP 139/96 | HR 78 | Temp 97.0°F | Resp 15 | Ht 64.0 in | Wt 229.0 lb

## 2024-03-19 DIAGNOSIS — Z1211 Encounter for screening for malignant neoplasm of colon: Secondary | ICD-10-CM

## 2024-03-19 DIAGNOSIS — K2100 Gastro-esophageal reflux disease with esophagitis, without bleeding: Secondary | ICD-10-CM | POA: Diagnosis not present

## 2024-03-19 DIAGNOSIS — R12 Heartburn: Secondary | ICD-10-CM

## 2024-03-19 DIAGNOSIS — K2950 Unspecified chronic gastritis without bleeding: Secondary | ICD-10-CM | POA: Diagnosis not present

## 2024-03-19 DIAGNOSIS — K640 First degree hemorrhoids: Secondary | ICD-10-CM

## 2024-03-19 DIAGNOSIS — K2970 Gastritis, unspecified, without bleeding: Secondary | ICD-10-CM

## 2024-03-19 DIAGNOSIS — R110 Nausea: Secondary | ICD-10-CM

## 2024-03-19 MED ORDER — SODIUM CHLORIDE 0.9 % IV SOLN: 500.0000 mL | Freq: Once | INTRAVENOUS | Status: DC

## 2024-03-19 MED ORDER — OMEPRAZOLE 40 MG PO CPDR: DELAYED_RELEASE_CAPSULE | ORAL | 0 refills | Status: AC

## 2024-03-19 NOTE — Progress Notes (Signed)
 "   GASTROENTEROLOGY PROCEDURE H&P NOTE   Primary Care Physician: Silvano Angeline FALCON, NP    Reason for Procedure:   Nausea, generalized abdominal pain, GERD, heartburn, colon cancer screeening  Plan:    EGD, colonoscopy  Patient is appropriate for endoscopic procedure(s) in the ambulatory ([facility]) setting.  The nature of the procedure, as well as the risks, benefits, and alternatives were carefully and thoroughly reviewed with the patient. Ample time for discussion and questions allowed. The patient understood, was satisfied, and agreed to proceed. I personally addressed all patient questions and concerns.     HPI: Amber Payne is a 54 y.o. female who presents for EGD for evaluation of GERD, HB, nausea, and gen abdominal pain, along with colonoscopy for CRC screening.   Patient was most recently seen in the Gastroenterology Clinic on 02/20/2024.  No interval change in medical history since that appointment. Please refer to that note for full details regarding GI history and clinical presentation.   Past Medical History:  Diagnosis Date   Acute sinusitis    Acute upper respiratory infection    Allergy    Essential hypertension    Hyperlipidemia    Hypothyroidism    Vitamin D deficiency     Past Surgical History:  Procedure Laterality Date   CHOLECYSTECTOMY  03/05/1999   Gallbladder removed    Prior to Admission medications  Medication Sig Start Date End Date Taking? Authorizing Provider  acetaminophen  (TYLENOL ) 325 MG tablet Take 650 mg by mouth every 6 (six) hours as needed for mild pain or headache. Patient not taking: Reported on 02/20/2024    [provider]  buPROPion (WELLBUTRIN XL) 150 MG 24 hr tablet Take 150 mg by mouth every morning. Patient not taking: Reported on 02/20/2024 10/01/18   [provider]  Chlorphen-Pseudoephed-APAP (CORICIDIN D PO) Take 1 tablet by mouth 4 (four) times daily as needed (congestion). Patient not taking: Reported on  02/20/2024    [provider]  Cholecalciferol (VITAMIN D3) 1.25 MG (50000 UT) CAPS Take by mouth.    [provider]  diclofenac sodium (VOLTAREN) 1 % GEL Apply 1 g topically 4 (four) times daily as needed for muscle pain. Patient not taking: Reported on 02/20/2024 12/13/18   [provider]  gabapentin (NEURONTIN) 300 MG capsule Take 300 mg by mouth 3 (three) times daily. Patient not taking: Reported on 02/20/2024 12/29/18   [provider]  levothyroxine (SYNTHROID) 75 MCG tablet Take 75 mcg by mouth daily. 09/24/23   [provider]  lisinopril-hydrochlorothiazide (ZESTORETIC) 20-12.5 MG tablet Take 2 tablets by mouth daily. 12/29/18   [provider]  meloxicam (MOBIC) 7.5 MG tablet Take 7.5 mg by mouth 2 (two) times daily as needed for muscle spasms. Patient not taking: Reported on 02/20/2024 12/11/18   [provider]  methocarbamol  (ROBAXIN ) 500 MG tablet Take 1 tablet (500 mg total) by mouth 2 (two) times daily. Patient not taking: Reported on 02/20/2024 01/03/19   Neldon Hamp RAMAN, PA  omeprazole  (PRILOSEC) 40 MG capsule Take 1 capsule (40 mg total) by mouth daily before breakfast. 02/20/24   Collier, Amanda R, PA-C  ondansetron  (ZOFRAN ) 4 MG tablet Take 1 tablet (4 mg total) by mouth every 8 (eight) hours as needed for nausea or vomiting. 03/18/24   Suzann Inocente HERO, MD  rosuvastatin (CRESTOR) 10 MG tablet Take 10 mg by mouth at bedtime. Patient not taking: Reported on 02/20/2024 10/01/18   [provider]  Sodium Sulfate-Mag Sulfate-KCl (SUTAB )  [phone] MG TABS Take 24 tablets by mouth as directed. 02/20/24   Craig Alan SAUNDERS, PA-C  WEGOVY 0.25 MG/0.5ML SOAJ SQ injection INJECT 0.5ML SUBCUTANEOUSLY ONCE WEEKLY 12/08/23   [provider]    Current Outpatient Medications  Medication Sig Dispense Refill   acetaminophen  (TYLENOL ) 325 MG tablet Take 650 mg by mouth every 6 (six) hours as needed for mild pain  or headache. (Patient not taking: Reported on 02/20/2024)     buPROPion (WELLBUTRIN XL) 150 MG 24 hr tablet Take 150 mg by mouth every morning. (Patient not taking: Reported on 02/20/2024)     Chlorphen-Pseudoephed-APAP (CORICIDIN D PO) Take 1 tablet by mouth 4 (four) times daily as needed (congestion). (Patient not taking: Reported on 02/20/2024)     Cholecalciferol (VITAMIN D3) 1.25 MG (50000 UT) CAPS Take by mouth.     diclofenac sodium (VOLTAREN) 1 % GEL Apply 1 g topically 4 (four) times daily as needed for muscle pain. (Patient not taking: Reported on 02/20/2024)     gabapentin (NEURONTIN) 300 MG capsule Take 300 mg by mouth 3 (three) times daily. (Patient not taking: Reported on 02/20/2024)     levothyroxine (SYNTHROID) 75 MCG tablet Take 75 mcg by mouth daily.     lisinopril-hydrochlorothiazide (ZESTORETIC) 20-12.5 MG tablet Take 2 tablets by mouth daily.     meloxicam (MOBIC) 7.5 MG tablet Take 7.5 mg by mouth 2 (two) times daily as needed for muscle spasms. (Patient not taking: Reported on 02/20/2024)     methocarbamol  (ROBAXIN ) 500 MG tablet Take 1 tablet (500 mg total) by mouth 2 (two) times daily. (Patient not taking: Reported on 02/20/2024) 20 tablet 0   omeprazole  (PRILOSEC) 40 MG capsule Take 1 capsule (40 mg total) by mouth daily before breakfast. 90 capsule 3   ondansetron  (ZOFRAN ) 4 MG tablet Take 1 tablet (4 mg total) by mouth every 8 (eight) hours as needed for nausea or vomiting. 20 tablet 0   rosuvastatin (CRESTOR) 10 MG tablet Take 10 mg by mouth at bedtime. (Patient not taking: Reported on 02/20/2024)     Sodium Sulfate-Mag Sulfate-KCl (SUTAB ) [phone] MG TABS Take 24 tablets by mouth as directed. 24 tablet 0   WEGOVY 0.25 MG/0.5ML SOAJ SQ injection INJECT 0.5ML SUBCUTANEOUSLY ONCE WEEKLY     Current Facility-Administered Medications  Medication Dose Route Frequency Provider Last Rate Last Admin   0.9 %  sodium chloride  infusion  500 mL Intravenous Once Syncere Kaminski  V, DO        Allergies as of 03/19/2024 - Review Complete 03/19/2024  Allergen Reaction Noted   Phenazopyridine Itching 01/03/2019    Family History  Problem Relation Age of Onset   Diabetes Mother    Hypertension Mother    Cirrhosis Father     Social History   Socioeconomic History   Marital status: Married    Spouse name: Not on file   Number of children: Not on file   Years of education: Not on file   Highest education level: Not on file  Occupational History   Not on file  Tobacco Use   Smoking status: Never   Smokeless tobacco: Never  Vaping Use   Vaping status: Never Used  Substance and Sexual Activity   Alcohol use: Never   Drug use: Never   Sexual activity: Not on file  Other Topics Concern   Not on file  Social History Narrative   Not on file   Social Drivers of Health   Tobacco Use: Low Risk (  03/19/2024)   Patient History    Smoking Tobacco Use: Never    Smokeless Tobacco Use: Never    Passive Exposure: Not on file  Financial Resource Strain: Not on file  Food Insecurity: Not on file  Transportation Needs: Not on file  Physical Activity: Not on file  Stress: Not on file  Social Connections: Not on file  Intimate Partner Violence: Not on file  Depression (PHQ2-9): Not on file  Alcohol Screen: Not on file  Housing: Not on file  Utilities: Not on file  Health Literacy: Not on file    Physical Exam: Vital signs in last 24 hours: @BP  135/75   Pulse 71   Temp (!) 97 F (36.1 C) (Skin)   Ht 5' 4 (1.626 m)   Wt 229 lb (103.9 kg)   SpO2 98%   BMI 39.31 kg/m  GEN: NAD EYE: Sclerae anicteric ENT: MMM CV: Non-tachycardic Pulm: CTA b/l GI: Soft, NT/ND NEURO:  Alert & Oriented x 3   Sandor Flatter, DO [facility]   03/19/2024 1:57 PM  "

## 2024-03-19 NOTE — Progress Notes (Signed)
 Called to room to assist during endoscopic procedure.  Patient ID and intended procedure confirmed with present staff. Received instructions for my participation in the procedure from the performing physician.

## 2024-03-19 NOTE — Patient Instructions (Signed)
 - Resume previous diet. - Continue present medications. - Use Prilosec (omeprazole ) 40 mg PO BID for 8 weeks to promote mucosal healing of gastritis and erosive esophagitis.  If upper GI symptoms well controlled, can try reducing to 40 mg daily for continued reflux management. - Await pathology results. - Return to GI clinic in 3 months or sooner as needed.  YOU HAD AN ENDOSCOPIC PROCEDURE TODAY AT THE [facility]:   Refer to the procedure report that was given to you for any specific questions about what was found during the examination.  If the procedure report does not answer your questions, please call your gastroenterologist to clarify.  If you requested that your care partner not be given the details of your procedure findings, then the procedure report has been included in a sealed envelope for you to review at your convenience later.  YOU SHOULD EXPECT: Some feelings of bloating in the abdomen. Passage of more gas than usual.  Walking can help get rid of the air that was put into your GI tract during the procedure and reduce the bloating. If you had a lower endoscopy (such as a colonoscopy or flexible sigmoidoscopy) you may notice spotting of blood in your stool or on the toilet paper. If you underwent a bowel prep for your procedure, you may not have a normal bowel movement for a few days.  Please Note:  You might notice some irritation and congestion in your nose or some drainage.  This is from the oxygen used during your procedure.  There is no need for concern and it should clear up in a day or so.  SYMPTOMS TO REPORT IMMEDIATELY:  Following lower endoscopy (colonoscopy or flexible sigmoidoscopy):  Excessive amounts of blood in the stool  Significant tenderness or worsening of abdominal pains  Swelling of the abdomen that is new, acute  Fever of 100F or higher  Following upper endoscopy (EGD)  Vomiting of blood or coffee ground material  New chest pain or pain under  the shoulder blades  Painful or persistently difficult swallowing  New shortness of breath  Fever of 100F or higher  Black, tarry-looking stools  For urgent or emergent issues, a gastroenterologist can be reached at any hour by calling [phone]. Do not use MyChart messaging for urgent concerns.    DIET:  We do recommend a small meal at first, but then you may proceed to your regular diet.  Drink plenty of fluids but you should avoid alcoholic beverages for 24 hours.  ACTIVITY:  You should plan to take it easy for the rest of today and you should NOT DRIVE or use heavy machinery until tomorrow (because of the sedation medicines used during the test).    FOLLOW UP: Our staff will call the number listed on your records the next business day following your procedure.  We will call around 7:15- 8:00 am to check on you and address any questions or concerns that you may have regarding the information given to you following your procedure. If we do not reach you, we will leave a message.     If any biopsies were taken you will be contacted by phone or by letter within the next 1-3 weeks.  Please call us  at [phone] if you have not heard about the biopsies in 3 weeks.    SIGNATURES/CONFIDENTIALITY: You and/or your care partner have signed paperwork which will be entered into your electronic medical record.  These signatures attest to the fact  that that the information above on your After Visit Summary has been reviewed and is understood.  Full responsibility of the confidentiality of this discharge information lies with you and/or your care-partner.

## 2024-03-19 NOTE — Progress Notes (Signed)
 Pt's states no medical or surgical changes since previsit or office visit.

## 2024-03-19 NOTE — Op Note (Signed)
 [facility] Patient Name: Amber Payne Procedure Date: 03/19/2024 1:40 PM MRN: [id number] Endoscopist: Sandor Flatter , MD, [id number] Age: 54 Referring MD:  Date of Birth: 01-11-1971 Gender: Female Account #: [id number] Procedure:                Colonoscopy Indications:              Screening for colorectal malignant neoplasm, This                            is the patient's first colonoscopy Medicines:                Monitored Anesthesia Care Procedure:                Pre-Anesthesia Assessment:                           - Prior to the procedure, a History and Physical                            was performed, and patient medications and                            allergies were reviewed. The patient's tolerance of                            previous anesthesia was also reviewed. The risks                            and benefits of the procedure and the sedation                            options and risks were discussed with the patient.                            All questions were answered, and informed consent                            was obtained. Prior Anticoagulants: The patient has                            taken no anticoagulant or antiplatelet agents. ASA                            Grade Assessment: II - A patient with mild systemic                            disease. After reviewing the risks and benefits,                            the patient was deemed in satisfactory condition to                            undergo the procedure.  After obtaining informed consent, the colonoscope                            was passed under direct vision. Throughout the                            procedure, the patient's blood pressure, pulse, and                            oxygen saturations were monitored continuously. The                            CF [id number] #[id number] was introduced through the anus                            and advanced to  the the terminal ileum. The                            colonoscopy was performed without difficulty. The                            patient tolerated the procedure well. The quality                            of the bowel preparation was good. The terminal                            ileum, ileocecal valve, appendiceal orifice, and                            rectum were photographed. Scope In: 2:20:24 PM Scope Out: 2:32:59 PM Scope Withdrawal Time: 0 hours 8 minutes 52 seconds  Total Procedure Duration: 0 hours 12 minutes 35 seconds  Findings:                 The perianal and digital rectal examinations were                            normal.                           The entire colon appeared normal.                           Non-bleeding internal hemorrhoids were found during                            retroflexion. The hemorrhoids were small and Grade                            I (internal hemorrhoids that do not prolapse).                           The terminal ileum appeared normal. Complications:            No immediate complications.  Estimated Blood Loss:     Estimated blood loss: none. Impression:               - The entire examined colon is normal.                           - Non-bleeding internal hemorrhoids.                           - The examined portion of the ileum was normal.                           - No specimens collected. Recommendation:           - Patient has a contact number available for                            emergencies. The signs and symptoms of potential                            delayed complications were discussed with the                            patient. Return to normal activities tomorrow.                            Written discharge instructions were provided to the                            patient.                           - Resume previous diet.                           - Continue present medications.                           - Repeat  colonoscopy in 10 years for screening                            purposes. Sandor Flatter, MD 03/19/2024 2:48:31 PM

## 2024-03-19 NOTE — Op Note (Signed)
 [facility] Patient Name: Amber Payne Procedure Date: 03/19/2024 1:53 PM MRN: [id number] Endoscopist: Sandor Flatter , MD, [id number] Age: 54 Referring MD:  Date of Birth: 1970/07/25 Gender: Female Account #: [id number] Procedure:                Upper GI endoscopy Indications:              Generalized abdominal pain, Heartburn, Suspected                            esophageal reflux, Nausea Medicines:                Monitored Anesthesia Care Procedure:                Pre-Anesthesia Assessment:                           - Prior to the procedure, a History and Physical                            was performed, and patient medications and                            allergies were reviewed. The patient's tolerance of                            previous anesthesia was also reviewed. The risks                            and benefits of the procedure and the sedation                            options and risks were discussed with the patient.                            All questions were answered, and informed consent                            was obtained. Prior Anticoagulants: The patient has                            taken no anticoagulant or antiplatelet agents. ASA                            Grade Assessment: II - A patient with mild systemic                            disease. After reviewing the risks and benefits,                            the patient was deemed in satisfactory condition to                            undergo the procedure.  After obtaining informed consent, the endoscope was                            passed under direct vision. Throughout the                            procedure, the patient's blood pressure, pulse, and                            oxygen saturations were monitored continuously. The                            GIF HQ190 #[id number] was introduced through the                            mouth, and advanced to the  third part of duodenum.                            The upper GI endoscopy was accomplished without                            difficulty. The patient tolerated the procedure                            well. Scope In: Scope Out: Findings:                 The upper third of the esophagus and middle third                            of the esophagus were normal.                           LA Grade A (one or more mucosal breaks less than 5                            mm, not extending between tops of 2 mucosal folds)                            esophagitis with no bleeding was found in the lower                            third of the esophagus.                           Moderate inflammation characterized by congestion                            (edema) and erythema was found in the distal                            gastric body and in the gastric antrum. Biopsies  were taken with a cold forceps for Helicobacter                            pylori testing. Estimated blood loss was minimal.                           The examined duodenum was normal. Biopsies were                            taken with a cold forceps for histology. Estimated                            blood loss was minimal. Complications:            No immediate complications. Estimated Blood Loss:     Estimated blood loss was minimal. Impression:               - Normal upper third of esophagus and middle third                            of esophagus.                           - LA Grade A reflux esophagitis with no bleeding.                           - Gastritis. Biopsied.                           - Normal examined duodenum. Biopsied. Recommendation:           - Patient has a contact number available for                            emergencies. The signs and symptoms of potential                            delayed complications were discussed with the                            patient. Return to normal  activities tomorrow.                            Written discharge instructions were provided to the                            patient.                           - Resume previous diet.                           - Continue present medications.                           - Use Prilosec (omeprazole ) 40 mg PO BID for 8  weeks to promote mucosal healing of gastritis and                            erosive esophagitis. If upper GI symptoms well                            controlled, can try reducing to 40 mg daily for                            continued reflux management.                           - Await pathology results.                           - Return to GI clinic in 3 months or sooner as                            needed.                           - Colonoscopy today. Sandor Flatter, MD 03/19/2024 2:45:48 PM

## 2024-03-19 NOTE — Progress Notes (Signed)
 1420  Pt experienced laryngeal spasm with jaw thrust performed. Nasopharyngeal airway 7.0 size   placed without trauma, vss

## 2024-03-19 NOTE — Progress Notes (Signed)
1405 Robinul 0.1 mg IV given due large amount of secretions upon assessment.  MD made aware, vss  

## 2024-03-19 NOTE — Progress Notes (Signed)
 Report given to PACU, vss

## 2024-03-22 ENCOUNTER — Telehealth: Payer: Self-pay

## 2024-03-22 NOTE — Telephone Encounter (Signed)
No answer on follow up call. 

## 2024-03-24 LAB — SURGICAL PATHOLOGY

## 2024-03-30 ENCOUNTER — Ambulatory Visit: Payer: Self-pay | Admitting: Gastroenterology
